# Patient Record
Sex: Male | Born: 1960 | Race: White | Hispanic: No
Health system: Southern US, Community
[De-identification: ages and names within clinical notes are randomized; demographics above are authoritative.]

---

## 1998-06-12 ENCOUNTER — Emergency Department (HOSPITAL_COMMUNITY): Admission: EM | Admit: 1998-06-12 | Discharge: 1998-06-12 | Payer: Self-pay | Admitting: Emergency Medicine

## 2004-10-29 ENCOUNTER — Ambulatory Visit: Payer: Self-pay | Admitting: Internal Medicine

## 2004-11-03 ENCOUNTER — Ambulatory Visit: Payer: Self-pay | Admitting: Internal Medicine

## 2014-12-10 ENCOUNTER — Other Ambulatory Visit (HOSPITAL_COMMUNITY): Payer: Self-pay | Admitting: Nurse Practitioner

## 2014-12-10 ENCOUNTER — Ambulatory Visit (HOSPITAL_COMMUNITY)
Admission: RE | Admit: 2014-12-10 | Discharge: 2014-12-10 | Disposition: A | Discharge status: Home / Self Care | Payer: BLUE CROSS/BLUE SHIELD | Source: Ambulatory Visit | Attending: Nurse Practitioner | Admitting: Nurse Practitioner

## 2014-12-10 DIAGNOSIS — M79674 Pain in right toe(s): Secondary | ICD-10-CM

## 2014-12-10 DIAGNOSIS — M19071 Primary osteoarthritis, right ankle and foot: Secondary | ICD-10-CM | POA: Diagnosis not present

## 2014-12-10 DIAGNOSIS — M25512 Pain in left shoulder: Secondary | ICD-10-CM | POA: Diagnosis not present

## 2018-09-07 DIAGNOSIS — Z23 Encounter for immunization: Secondary | ICD-10-CM | POA: Diagnosis not present

## 2018-09-11 DIAGNOSIS — Z1159 Encounter for screening for other viral diseases: Secondary | ICD-10-CM | POA: Diagnosis not present

## 2018-09-11 DIAGNOSIS — Z0000 Encounter for general adult medical examination without abnormal findings: Secondary | ICD-10-CM | POA: Diagnosis not present

## 2018-09-11 DIAGNOSIS — Z125 Encounter for screening for malignant neoplasm of prostate: Secondary | ICD-10-CM | POA: Diagnosis not present

## 2018-09-14 DIAGNOSIS — M25512 Pain in left shoulder: Secondary | ICD-10-CM | POA: Diagnosis not present

## 2018-09-14 DIAGNOSIS — M545 Low back pain: Secondary | ICD-10-CM | POA: Diagnosis not present

## 2018-09-14 DIAGNOSIS — Z0000 Encounter for general adult medical examination without abnormal findings: Secondary | ICD-10-CM | POA: Diagnosis not present

## 2018-09-14 DIAGNOSIS — F17210 Nicotine dependence, cigarettes, uncomplicated: Secondary | ICD-10-CM | POA: Diagnosis not present

## 2018-09-14 DIAGNOSIS — Z1212 Encounter for screening for malignant neoplasm of rectum: Secondary | ICD-10-CM | POA: Diagnosis not present

## 2018-12-14 DIAGNOSIS — M25511 Pain in right shoulder: Secondary | ICD-10-CM | POA: Diagnosis not present

## 2018-12-14 DIAGNOSIS — R7303 Prediabetes: Secondary | ICD-10-CM | POA: Diagnosis not present

## 2018-12-14 DIAGNOSIS — G8929 Other chronic pain: Secondary | ICD-10-CM | POA: Diagnosis not present

## 2018-12-14 DIAGNOSIS — F17210 Nicotine dependence, cigarettes, uncomplicated: Secondary | ICD-10-CM | POA: Diagnosis not present

## 2019-01-02 DIAGNOSIS — M67912 Unspecified disorder of synovium and tendon, left shoulder: Secondary | ICD-10-CM | POA: Diagnosis not present

## 2019-01-02 DIAGNOSIS — M19011 Primary osteoarthritis, right shoulder: Secondary | ICD-10-CM | POA: Diagnosis not present

## 2019-01-02 DIAGNOSIS — M19012 Primary osteoarthritis, left shoulder: Secondary | ICD-10-CM | POA: Diagnosis not present

## 2019-01-02 DIAGNOSIS — M67911 Unspecified disorder of synovium and tendon, right shoulder: Secondary | ICD-10-CM | POA: Diagnosis not present

## 2019-05-02 DIAGNOSIS — F988 Other specified behavioral and emotional disorders with onset usually occurring in childhood and adolescence: Secondary | ICD-10-CM | POA: Diagnosis not present

## 2019-09-17 DIAGNOSIS — Z0000 Encounter for general adult medical examination without abnormal findings: Secondary | ICD-10-CM | POA: Diagnosis not present

## 2019-09-17 DIAGNOSIS — Z125 Encounter for screening for malignant neoplasm of prostate: Secondary | ICD-10-CM | POA: Diagnosis not present

## 2019-10-23 ENCOUNTER — Other Ambulatory Visit: Payer: Self-pay | Admitting: Internal Medicine

## 2019-10-23 DIAGNOSIS — Z1212 Encounter for screening for malignant neoplasm of rectum: Secondary | ICD-10-CM | POA: Diagnosis not present

## 2019-10-23 DIAGNOSIS — F900 Attention-deficit hyperactivity disorder, predominantly inattentive type: Secondary | ICD-10-CM | POA: Diagnosis not present

## 2019-10-23 DIAGNOSIS — Z0000 Encounter for general adult medical examination without abnormal findings: Secondary | ICD-10-CM | POA: Diagnosis not present

## 2019-10-23 DIAGNOSIS — J309 Allergic rhinitis, unspecified: Secondary | ICD-10-CM | POA: Diagnosis not present

## 2019-10-23 DIAGNOSIS — F17210 Nicotine dependence, cigarettes, uncomplicated: Secondary | ICD-10-CM | POA: Diagnosis not present

## 2019-10-24 DIAGNOSIS — Z23 Encounter for immunization: Secondary | ICD-10-CM | POA: Diagnosis not present

## 2019-10-28 ENCOUNTER — Ambulatory Visit: Payer: BC Managed Care – PPO | Attending: Internal Medicine

## 2019-10-28 DIAGNOSIS — Z23 Encounter for immunization: Secondary | ICD-10-CM

## 2019-10-28 NOTE — Progress Notes (Signed)
   Covid-[facility] Vaccination [facility]  Name:  Ethan Chen    MRN: [id number] DOB: 04-08-1961  10/28/2019  [name] was observed post Covid-19 immunization for 15 minutes without incident. He was provided with Vaccine Information Sheet and instruction to access the V-Safe system.   [name] was instructed to call 911 with any severe reactions post vaccine: Marland Kitchen Difficulty breathing  . Swelling of face and throat  . A fast heartbeat  . A bad rash all over body  . Dizziness and weakness   Immunizations Administered    Name Date Dose VIS Date Route   Pfizer COVID-19 Vaccine 10/28/2019  5:17 PM 0.3 mL 08/03/2019 Intramuscular   Manufacturer: [employer], [employer]   Lot: [id number]   NDC: [address]-1

## 2019-11-05 ENCOUNTER — Inpatient Hospital Stay: Admission: RE | Admit: 2019-11-05 | Payer: BLUE CROSS/BLUE SHIELD | Source: Ambulatory Visit

## 2019-11-16 ENCOUNTER — Other Ambulatory Visit: Payer: Self-pay

## 2019-11-16 ENCOUNTER — Ambulatory Visit
Admission: RE | Admit: 2019-11-16 | Discharge: 2019-11-16 | Disposition: A | Discharge status: Home / Self Care | Payer: BC Managed Care – PPO | Source: Ambulatory Visit | Attending: Internal Medicine | Admitting: Internal Medicine

## 2019-11-16 DIAGNOSIS — F17210 Nicotine dependence, cigarettes, uncomplicated: Secondary | ICD-10-CM

## 2019-11-16 DIAGNOSIS — Z87891 Personal history of nicotine dependence: Secondary | ICD-10-CM | POA: Diagnosis not present

## 2019-11-28 ENCOUNTER — Ambulatory Visit: Payer: BC Managed Care – PPO | Attending: Internal Medicine

## 2019-11-28 DIAGNOSIS — Z23 Encounter for immunization: Secondary | ICD-10-CM

## 2019-11-28 NOTE — Progress Notes (Signed)
   Covid-19 Vaccination Clinic  Name:  Ethan Chen    MRN: [id number] DOB: [birth year]  11/28/2019  [name] was observed post Covid-19 immunization for 15 minutes without incident. He was provided with Vaccine Information Sheet and instruction to access the V-Safe system.   [name] was instructed to call 911 with any severe reactions post vaccine: Marland Kitchen Difficulty breathing  . Swelling of face and throat  . A fast heartbeat  . A bad rash all over body  . Dizziness and weakness   Immunizations Administered    Name Date Dose VIS Date Route   Pfizer COVID-19 Vaccine 11/28/2019  2:58 PM 0.3 mL 08/03/2019 Intramuscular   Manufacturer: [employer], [employer]   Lot: [id number]   NDC: [address]-7

## 2019-12-10 ENCOUNTER — Ambulatory Visit: Payer: Self-pay | Attending: Internal Medicine

## 2019-12-10 DIAGNOSIS — Z20822 Contact with and (suspected) exposure to covid-19: Secondary | ICD-10-CM

## 2019-12-11 LAB — NOVEL CORONAVIRUS, NAA: SARS-CoV-2, NAA: NOT DETECTED

## 2019-12-11 LAB — SARS-COV-2, NAA 2 DAY TAT

## 2020-01-09 DIAGNOSIS — F17210 Nicotine dependence, cigarettes, uncomplicated: Secondary | ICD-10-CM | POA: Diagnosis not present

## 2020-01-09 DIAGNOSIS — K8020 Calculus of gallbladder without cholecystitis without obstruction: Secondary | ICD-10-CM | POA: Diagnosis not present

## 2020-01-09 DIAGNOSIS — F900 Attention-deficit hyperactivity disorder, predominantly inattentive type: Secondary | ICD-10-CM | POA: Diagnosis not present

## 2020-01-09 DIAGNOSIS — J439 Emphysema, unspecified: Secondary | ICD-10-CM | POA: Diagnosis not present

## 2020-03-03 ENCOUNTER — Other Ambulatory Visit: Payer: Self-pay | Admitting: *Deleted

## 2020-03-03 DIAGNOSIS — Z87891 Personal history of nicotine dependence: Secondary | ICD-10-CM

## 2020-03-04 ENCOUNTER — Other Ambulatory Visit (HOSPITAL_COMMUNITY)
Admission: RE | Admit: 2020-03-04 | Discharge: 2020-03-04 | Disposition: A | Discharge status: Home / Self Care | Payer: BC Managed Care – PPO | Source: Ambulatory Visit | Attending: Internal Medicine | Admitting: Internal Medicine

## 2020-03-04 DIAGNOSIS — Z01812 Encounter for preprocedural laboratory examination: Secondary | ICD-10-CM | POA: Diagnosis not present

## 2020-03-04 DIAGNOSIS — Z20822 Contact with and (suspected) exposure to covid-19: Secondary | ICD-10-CM | POA: Diagnosis not present

## 2020-03-04 LAB — SARS CORONAVIRUS 2 (TAT 6-24 HRS): SARS Coronavirus 2: NEGATIVE

## 2020-03-07 ENCOUNTER — Other Ambulatory Visit: Payer: Self-pay

## 2020-03-07 ENCOUNTER — Ambulatory Visit (INDEPENDENT_AMBULATORY_CARE_PROVIDER_SITE_OTHER): Payer: BC Managed Care – PPO | Admitting: Internal Medicine

## 2020-03-07 DIAGNOSIS — Z87891 Personal history of nicotine dependence: Secondary | ICD-10-CM | POA: Diagnosis not present

## 2020-03-07 LAB — PULMONARY FUNCTION TEST
DL/VA % pred: 96 %
DL/VA: 4.09 ml/min/mmHg/L
DLCO cor % pred: 113 %
DLCO cor: 33.22 ml/min/mmHg
DLCO unc % pred: 113 %
DLCO unc: 33.22 ml/min/mmHg
FEF 25-75 Post: 2.99 L/sec
FEF 25-75 Pre: 4.73 L/sec
FEF2575-%Change-Post: -36 %
FEF2575-%Pred-Post: 93 %
FEF2575-%Pred-Pre: 147 %
FEV1-%Change-Post: -12 %
FEV1-%Pred-Post: 100 %
FEV1-%Pred-Pre: 114 %
FEV1-Post: 3.87 L
FEV1-Pre: 4.44 L
FEV1FVC-%Change-Post: -10 %
FEV1FVC-%Pred-Pre: 109 %
FEV6-%Change-Post: -1 %
FEV6-%Pred-Post: 107 %
FEV6-%Pred-Pre: 109 %
FEV6-Post: 5.23 L
FEV6-Pre: 5.34 L
FEV6FVC-%Change-Post: 0 %
FEV6FVC-%Pred-Post: 104 %
FEV6FVC-%Pred-Pre: 104 %
FVC-%Change-Post: -2 %
FVC-%Pred-Post: 103 %
FVC-%Pred-Pre: 105 %
FVC-Post: 5.24 L
FVC-Pre: 5.35 L
Post FEV1/FVC ratio: 74 %
Post FEV6/FVC ratio: 100 %
Pre FEV1/FVC ratio: 83 %
Pre FEV6/FVC Ratio: 100 %
RV % pred: 124 %
RV: 2.84 L
TLC % pred: 120 %
TLC: 8.77 L

## 2020-03-07 NOTE — Progress Notes (Signed)
Full PFT performed today. °

## 2020-04-09 DIAGNOSIS — B001 Herpesviral vesicular dermatitis: Secondary | ICD-10-CM | POA: Diagnosis not present

## 2020-04-09 DIAGNOSIS — N529 Male erectile dysfunction, unspecified: Secondary | ICD-10-CM | POA: Diagnosis not present

## 2020-04-09 DIAGNOSIS — F988 Other specified behavioral and emotional disorders with onset usually occurring in childhood and adolescence: Secondary | ICD-10-CM | POA: Diagnosis not present

## 2020-07-08 DIAGNOSIS — R7303 Prediabetes: Secondary | ICD-10-CM | POA: Diagnosis not present

## 2020-07-08 DIAGNOSIS — Z23 Encounter for immunization: Secondary | ICD-10-CM | POA: Diagnosis not present

## 2020-07-08 DIAGNOSIS — F988 Other specified behavioral and emotional disorders with onset usually occurring in childhood and adolescence: Secondary | ICD-10-CM | POA: Diagnosis not present

## 2020-07-08 DIAGNOSIS — N529 Male erectile dysfunction, unspecified: Secondary | ICD-10-CM | POA: Diagnosis not present

## 2020-10-07 DIAGNOSIS — Z03818 Encounter for observation for suspected exposure to other biological agents ruled out: Secondary | ICD-10-CM | POA: Diagnosis not present

## 2020-10-07 DIAGNOSIS — Z20822 Contact with and (suspected) exposure to covid-19: Secondary | ICD-10-CM | POA: Diagnosis not present

## 2020-10-22 DIAGNOSIS — Z0000 Encounter for general adult medical examination without abnormal findings: Secondary | ICD-10-CM | POA: Diagnosis not present

## 2020-10-22 DIAGNOSIS — R7303 Prediabetes: Secondary | ICD-10-CM | POA: Diagnosis not present

## 2020-10-22 DIAGNOSIS — I2510 Atherosclerotic heart disease of native coronary artery without angina pectoris: Secondary | ICD-10-CM | POA: Diagnosis not present

## 2020-10-22 DIAGNOSIS — Z125 Encounter for screening for malignant neoplasm of prostate: Secondary | ICD-10-CM | POA: Diagnosis not present

## 2020-10-22 DIAGNOSIS — E7800 Pure hypercholesterolemia, unspecified: Secondary | ICD-10-CM | POA: Diagnosis not present

## 2020-10-27 ENCOUNTER — Other Ambulatory Visit: Payer: BC Managed Care – PPO | Admitting: Internal Medicine

## 2020-10-27 ENCOUNTER — Other Ambulatory Visit: Payer: Self-pay | Admitting: Internal Medicine

## 2020-10-27 DIAGNOSIS — S61012A Laceration without foreign body of left thumb without damage to nail, initial encounter: Secondary | ICD-10-CM | POA: Diagnosis not present

## 2020-10-27 DIAGNOSIS — B001 Herpesviral vesicular dermatitis: Secondary | ICD-10-CM | POA: Diagnosis not present

## 2020-10-27 DIAGNOSIS — J309 Allergic rhinitis, unspecified: Secondary | ICD-10-CM | POA: Diagnosis not present

## 2020-10-27 DIAGNOSIS — F988 Other specified behavioral and emotional disorders with onset usually occurring in childhood and adolescence: Secondary | ICD-10-CM | POA: Diagnosis not present

## 2020-10-27 DIAGNOSIS — F17210 Nicotine dependence, cigarettes, uncomplicated: Secondary | ICD-10-CM

## 2020-10-27 DIAGNOSIS — Z0000 Encounter for general adult medical examination without abnormal findings: Secondary | ICD-10-CM | POA: Diagnosis not present

## 2020-10-27 DIAGNOSIS — Z23 Encounter for immunization: Secondary | ICD-10-CM | POA: Diagnosis not present

## 2020-10-27 DIAGNOSIS — R7303 Prediabetes: Secondary | ICD-10-CM | POA: Diagnosis not present

## 2020-11-03 DIAGNOSIS — M5442 Lumbago with sciatica, left side: Secondary | ICD-10-CM | POA: Diagnosis not present

## 2020-11-03 DIAGNOSIS — M5459 Other low back pain: Secondary | ICD-10-CM | POA: Diagnosis not present

## 2020-11-08 DIAGNOSIS — M5416 Radiculopathy, lumbar region: Secondary | ICD-10-CM | POA: Diagnosis not present

## 2020-11-11 DIAGNOSIS — M48062 Spinal stenosis, lumbar region with neurogenic claudication: Secondary | ICD-10-CM | POA: Diagnosis not present

## 2020-12-01 ENCOUNTER — Inpatient Hospital Stay: Admission: RE | Admit: 2020-12-01 | Payer: BC Managed Care – PPO | Source: Ambulatory Visit

## 2020-12-02 DIAGNOSIS — M5416 Radiculopathy, lumbar region: Secondary | ICD-10-CM | POA: Diagnosis not present

## 2020-12-16 ENCOUNTER — Ambulatory Visit
Admission: RE | Admit: 2020-12-16 | Discharge: 2020-12-16 | Disposition: A | Discharge status: Home / Self Care | Payer: BC Managed Care – PPO | Source: Ambulatory Visit | Attending: Internal Medicine | Admitting: Internal Medicine

## 2020-12-16 DIAGNOSIS — F17210 Nicotine dependence, cigarettes, uncomplicated: Secondary | ICD-10-CM

## 2020-12-16 DIAGNOSIS — Z87891 Personal history of nicotine dependence: Secondary | ICD-10-CM | POA: Diagnosis not present

## 2020-12-19 DIAGNOSIS — M5459 Other low back pain: Secondary | ICD-10-CM | POA: Diagnosis not present

## 2020-12-22 DIAGNOSIS — F900 Attention-deficit hyperactivity disorder, predominantly inattentive type: Secondary | ICD-10-CM | POA: Diagnosis not present

## 2020-12-22 DIAGNOSIS — K8020 Calculus of gallbladder without cholecystitis without obstruction: Secondary | ICD-10-CM | POA: Diagnosis not present

## 2020-12-22 DIAGNOSIS — Z23 Encounter for immunization: Secondary | ICD-10-CM | POA: Diagnosis not present

## 2020-12-22 DIAGNOSIS — I2510 Atherosclerotic heart disease of native coronary artery without angina pectoris: Secondary | ICD-10-CM | POA: Diagnosis not present

## 2021-01-08 DIAGNOSIS — M545 Low back pain, unspecified: Secondary | ICD-10-CM | POA: Diagnosis not present

## 2021-07-02 IMAGING — CT CT CHEST LUNG CANCER SCREENING LOW DOSE W/O CM
1 series · 10 of 10 positions shown, 13 images · non-contrast
Comparison: 11/16/2019

CLINICAL DATA: 35 pack-year smoking history, quitting 1.5 years
ago.

EXAM:
CT CHEST WITHOUT CONTRAST LOW-DOSE FOR LUNG CANCER SCREENING
TECHNIQUE: Multidetector CT imaging of the chest was performed following the
standard protocol without IV contrast.

[ct lung segmentation data · axial · 0.76mm/px · z∈[-380,-380]mm · 10 of 332 frames shown]
[frame 1/332  mediastinal]
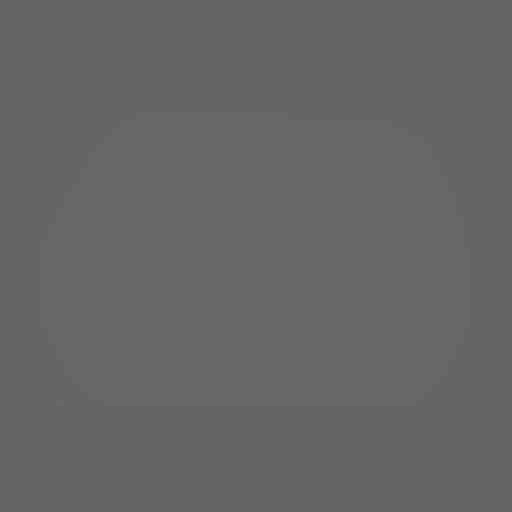
[frame 1/332  lung]
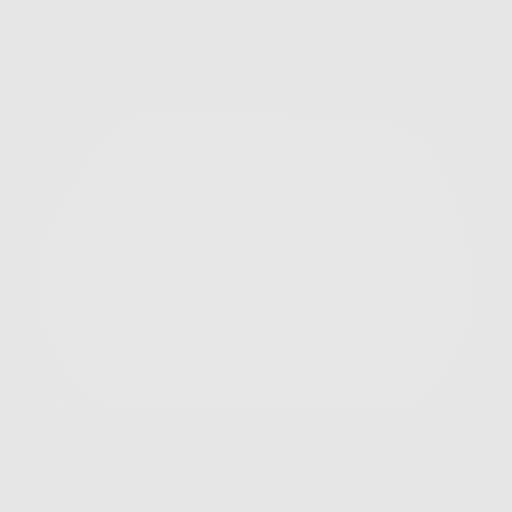
[frame 37/332  lung]
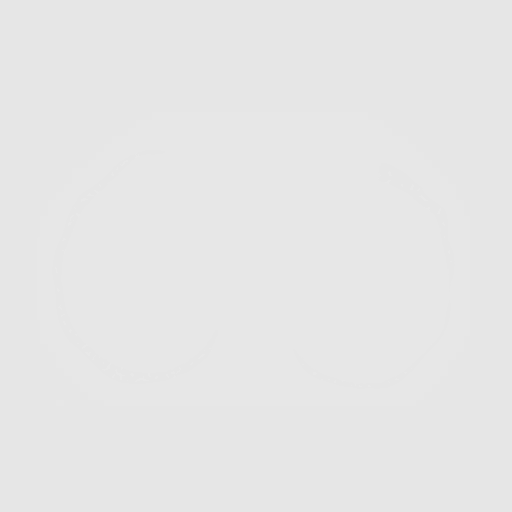
[frame 74/332  lung]
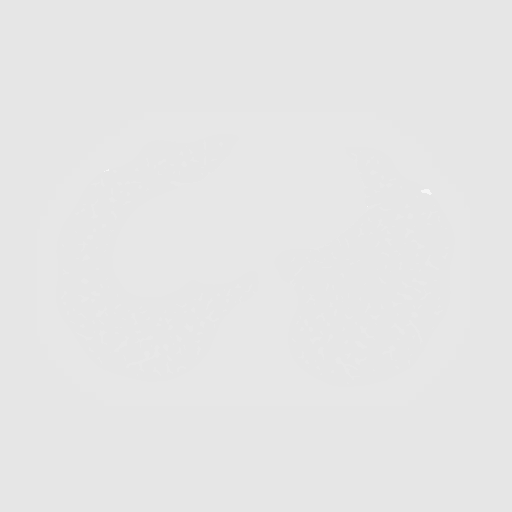
[frame 111/332  lung]
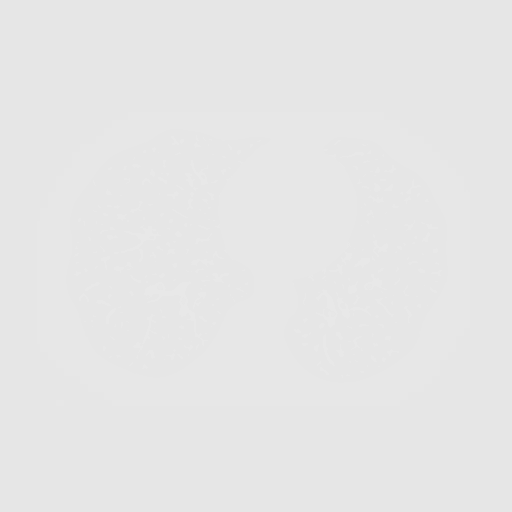
[frame 148/332  mediastinal]
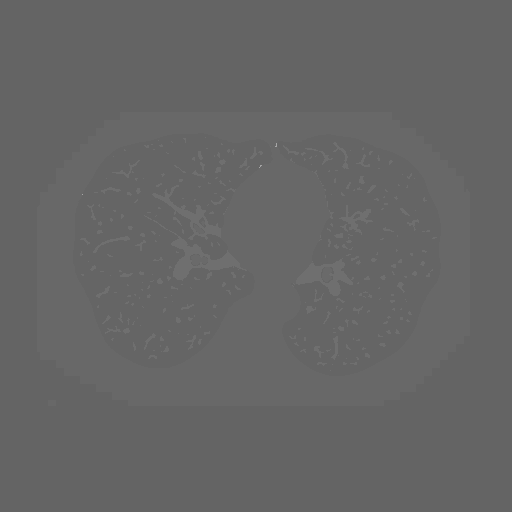
[frame 148/332  lung]
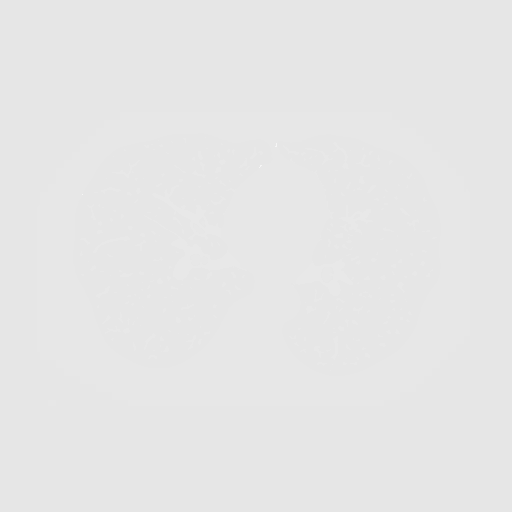
[frame 184/332  lung]
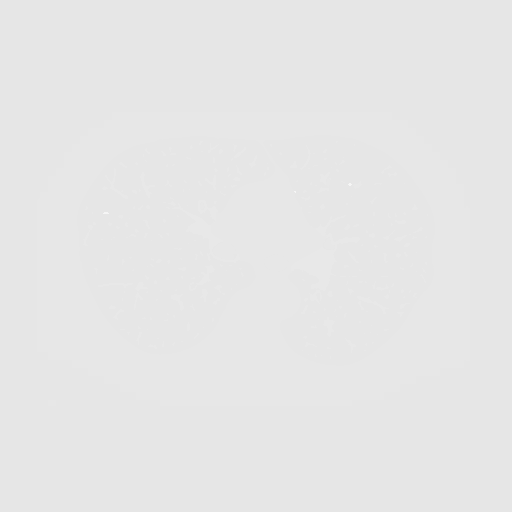
[frame 221/332  lung]
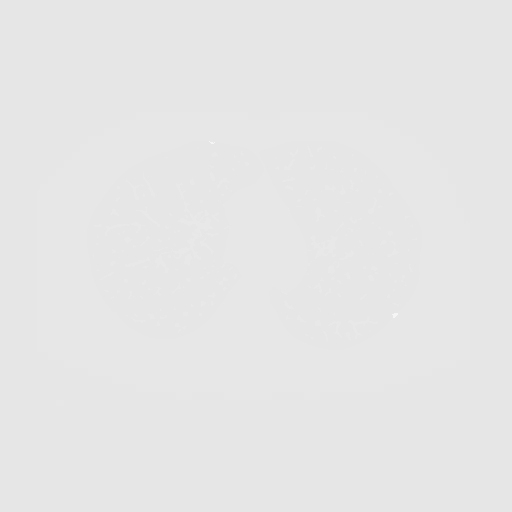
[frame 258/332  lung]
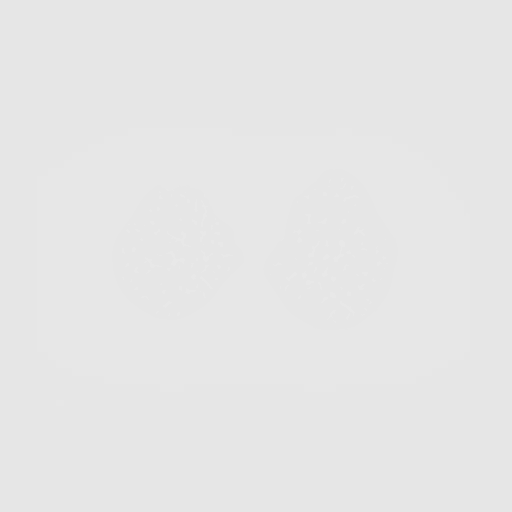
[frame 295/332  mediastinal]
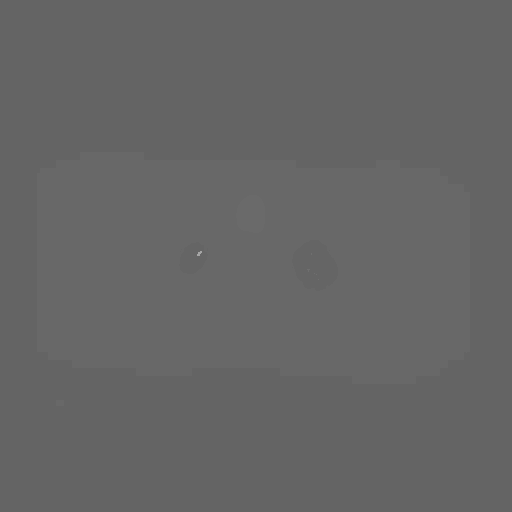
[frame 295/332  lung]
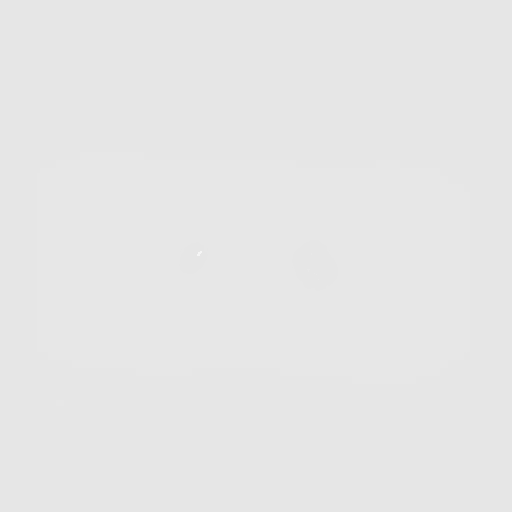
[frame 332/332  lung]
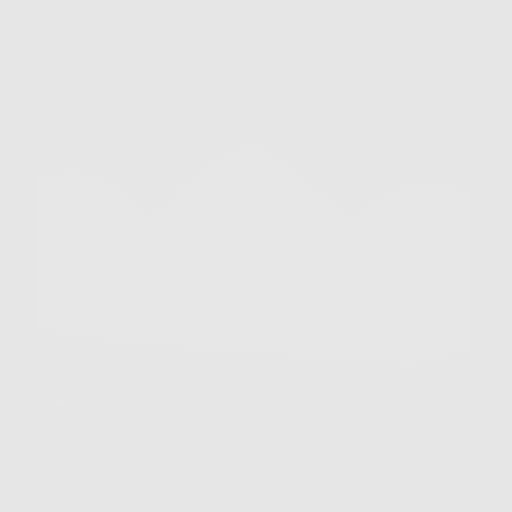

[10 of 10 positions shown; findings below may reference images not displayed]

FINDINGS: Cardiovascular: Normal aortic caliber. Normal heart size, without
pericardial effusion.

Mediastinum/Nodes: No mediastinal or definite hilar adenopathy,
given limitations of unenhanced CT.

Lungs/Pleura: No pleural fluid. A nodule along the right minor
fissure is similar at volume derived equivalent diameter 4.7 mm.

Upper Abdomen: Tiny dependent gallstones. Normal imaged portions of
the liver, spleen, stomach, pancreas, adrenal glands, kidneys.

Musculoskeletal: No acute osseous abnormality.
IMPRESSION: 1. Lung-RADS 2, benign appearance or behavior. Continue annual
screening with low-dose chest CT without contrast in 12 months.
2. Cholelithiasis.

## 2022-12-21 ENCOUNTER — Encounter: Payer: Self-pay | Admitting: Internal Medicine

## 2023-01-10 ENCOUNTER — Other Ambulatory Visit: Payer: Self-pay | Admitting: Internal Medicine

## 2023-01-10 ENCOUNTER — Encounter: Payer: Self-pay | Admitting: Internal Medicine

## 2023-01-10 DIAGNOSIS — Z122 Encounter for screening for malignant neoplasm of respiratory organs: Secondary | ICD-10-CM
